# Patient Record
Sex: Female | Born: 1975 | Race: Black or African American | Hispanic: No | Marital: Married | Smoking: Never smoker
Health system: Southern US, Community
[De-identification: ages and names within clinical notes are randomized; demographics above are authoritative.]

---

## 2004-04-08 ENCOUNTER — Observation Stay: Payer: Self-pay | Admitting: Obstetrics & Gynecology

## 2004-04-09 ENCOUNTER — Inpatient Hospital Stay: Payer: Self-pay | Admitting: Obstetrics & Gynecology

## 2011-12-17 DIAGNOSIS — O365990 Maternal care for other known or suspected poor fetal growth, unspecified trimester, not applicable or unspecified: Secondary | ICD-10-CM

## 2011-12-17 DIAGNOSIS — O1410 Severe pre-eclampsia, unspecified trimester: Secondary | ICD-10-CM

## 2012-02-14 LAB — PROTEIN / CREATININE RATIO, URINE
Protein, Random Urine: 198 mg/dL — ABNORMAL HIGH (ref 0–12)
Protein/Creat. Ratio: 851 mg/gCREAT — ABNORMAL HIGH (ref 0–200)

## 2012-02-14 LAB — PIH PROFILE
Anion Gap: 7 (ref 7–16)
BUN: 11 mg/dL (ref 7–18)
Chloride: 109 mmol/L — ABNORMAL HIGH (ref 98–107)
Creatinine: 0.49 mg/dL — ABNORMAL LOW (ref 0.60–1.30)
EGFR (African American): >60
EGFR (Non-African Amer.): >60
HCT: 31.8 % — ABNORMAL LOW (ref 35.0–47.0)
MCH: 29.9 pg (ref 26.0–34.0)
MCHC: 33.1 g/dL (ref 32.0–36.0)
MCV: 91 fL (ref 80–100)
Osmolality: 279 (ref 275–301)
RBC: 3.51 10*6/uL — ABNORMAL LOW (ref 3.80–5.20)
RDW: 17.1 % — ABNORMAL HIGH (ref 11.5–14.5)
SGOT(AST): 34 U/L (ref 15–37)
Sodium: 140 mmol/L (ref 136–145)
Uric Acid: 4.1 mg/dL (ref 2.6–6.0)
WBC: 6.6 10*3/uL (ref 3.6–11.0)

## 2012-02-15 LAB — PROTEIN, URINE, 24 HOUR: Protein, 24 Hour Urine: 1032 mg/24HR — ABNORMAL HIGH (ref 30–149)

## 2012-02-16 ENCOUNTER — Inpatient Hospital Stay: Payer: Self-pay

## 2012-02-16 LAB — PIH PROFILE
BUN: 9 mg/dL (ref 7–18)
Chloride: 106 mmol/L (ref 98–107)
Co2: 21 mmol/L (ref 21–32)
Creatinine: 0.55 mg/dL — ABNORMAL LOW (ref 0.60–1.30)
HGB: 10.5 g/dL — ABNORMAL LOW (ref 12.0–16.0)
MCH: 29.3 pg (ref 26.0–34.0)
MCV: 91 fL (ref 80–100)
Osmolality: 276 (ref 275–301)
Platelet: 307 10*3/uL (ref 150–440)
Potassium: 4.3 mmol/L (ref 3.5–5.1)
RBC: 3.58 10*6/uL — ABNORMAL LOW (ref 3.80–5.20)
RDW: 17.4 % — ABNORMAL HIGH (ref 11.5–14.5)

## 2012-02-16 LAB — PLATELET COUNT: Platelet: 320 10*3/uL (ref 150–440)

## 2012-02-17 LAB — CBC WITH DIFFERENTIAL/PLATELET
Basophil #: 0 10*3/uL (ref 0.0–0.1)
HCT: 31.7 % — ABNORMAL LOW (ref 35.0–47.0)
HGB: 10 g/dL — ABNORMAL LOW (ref 12.0–16.0)
Lymphocyte #: 1 10*3/uL (ref 1.0–3.6)
MCH: 28.7 pg (ref 26.0–34.0)
MCV: 91 fL (ref 80–100)
Monocyte #: 0.7 x10 3/mm (ref 0.2–0.9)
Monocyte %: 5 %
Neutrophil #: 12.2 10*3/uL — ABNORMAL HIGH (ref 1.4–6.5)
Neutrophil %: 87.7 %
Platelet: 313 10*3/uL (ref 150–440)
RBC: 3.48 10*6/uL — ABNORMAL LOW (ref 3.80–5.20)
RDW: 17.7 % — ABNORMAL HIGH (ref 11.5–14.5)
WBC: 14 10*3/uL — ABNORMAL HIGH (ref 3.6–11.0)

## 2012-02-17 LAB — COMPREHENSIVE METABOLIC PANEL
Albumin: 2.4 g/dL — ABNORMAL LOW (ref 3.4–5.0)
Anion Gap: 9 (ref 7–16)
Calcium, Total: 7.7 mg/dL — ABNORMAL LOW (ref 8.5–10.1)
Chloride: 106 mmol/L (ref 98–107)
Co2: 24 mmol/L (ref 21–32)
EGFR (African American): >60
Glucose: 129 mg/dL — ABNORMAL HIGH (ref 65–99)
Osmolality: 278 (ref 275–301)
Potassium: 4.5 mmol/L (ref 3.5–5.1)
SGOT(AST): 39 U/L — ABNORMAL HIGH (ref 15–37)
SGPT (ALT): 62 U/L (ref 12–78)
Sodium: 139 mmol/L (ref 136–145)

## 2012-03-30 ENCOUNTER — Ambulatory Visit: Payer: Self-pay | Admitting: Internal Medicine

## 2014-06-14 NOTE — Consult Note (Signed)
    Maternal Age 40    Gravida 3    Para 2    Term Deliveries 2    Preterm Deliveries 0    Abortions 1    Living Children 2    Gestational Age (wk, days based on mom's dates) 7334    Gestation Single     Additional Comments Called to consult on [name] a 39 year old G3P2012 presenting at 4634 0/[redacted] wks gestation with PIH.  I discussed with mother the morbidity and mortality of infants born at 1134 weeks. We discussed brain, eye, and lung development. We discussed the average weight for this gestation age as well as thermoregulation and the possible need for an isolette. We discussed resuscitation, PIV, lines, respiratory support as well as og tubes. We discussed that the majority of the time these infants do very well and have Po feeding difficulties, and that this Po feeding difficulty is what keeps them hospitalized.  We discussed average discharge date would be at her due date.  I answered all of mother and father???s questions. I am available to speak with them at any time if they have any further follow up needs. Thank you for allowing me to consult on this wonderful family.   Prenatal labs pending at the time of this consultation. Due to possibility of preterm delivery, recommend a course of betamethasone to enhance lung maturity.  Total time 40 minutes    Parental Contact Parents informed at length regarding prenatal care and plan   Electronic Signatures: Corliss ParishGaliote, Elantra Caprara P (MD)  (Signed 21-Dec-13 01:14)  Authored: PREGNANCY and LABOR, ADDITIONAL COMMENTS   Last Updated: 21-Dec-13 01:14 by Corliss ParishGaliote, Izak Anding P (MD)

## 2014-06-17 NOTE — Op Note (Signed)
PATIENT NAME:  Christina Friedman, Christina Friedman MR#:  [id number] DATE OF BIRTH:  1975/07/29  DATE OF PROCEDURE:  02/16/2012  PREOPERATIVE DIAGNOSES:  1. Intrauterine pregnancy at 34 weeks, 3 days gestation. 2. Severe preeclampsia based on blood pressure criteria. 3. Intrauterine growth restriction.  4. Nonreassuring fetal surveillance.  5. Desires permanent surgical sterilization.  POSTOPERATIVE DIAGNOSES:  1. Intrauterine pregnancy at 34 weeks, 3 days gestation. 2. Severe preeclampsia based on blood pressure criteria. 3. Intrauterine growth restriction.  4. Nonreassuring fetal surveillance.  5. Desires permanent surgical sterilization.  PROCEDURES PERFORMED: Primary low transverse cesarean section with Pfannenstiel skin incision and bilateral tubal ligation via Pomeroy method.  PRIMARY SURGEON: Florina Ou. Bonney Aid, MD  ASSISTANT: Farrel Conners, CNM   PREOPERATIVE ANTIBIOTICS: Two grams of Ancef.   DRAINS OR TUBES: Foley to gravity drainage, On-Q catheter system.   IMPLANTS: None.   ANESTHESIA: General.   ESTIMATED BLOOD LOSS: 700 mL.  OPERATIVE FLUIDS: 1900 mL of crystalloid.   URINE OUTPUT: One liter of clear urine.   INTRAOPERATIVE FINDINGS: Normal tubes, uterus and ovaries bilaterally. Liveborn female infant weighing 1730 grams (3 pounds, 13 ounces), 1 minute Apgar 8 and 10 minute Apgar pending.   SPECIMENS REMOVED: Bilateral portion of fallopian tube as well as placenta.   PATIENT CONDITION FOLLOWING PROCEDURE: Stable.   COMPLICATIONS: None.   PROCEDURE IN DETAIL: Risks, benefits and alternatives of the procedure were discussed with the patient prior to proceeding to the operating room. The patient was taken to the operating room where spinal anesthesia was administered. The patient was then prepped and draped in the usual sterile fashion. Timeout was performed. A Pfannenstiel skin incision was made 2 cm above the pubic symphysis and carried down sharply to the level of the  rectus fascia. The fascia was incised in the midline using a knife. The fascial incision was then extended using Mayo scissors. The superior border of the rectus fascia was grasped with two Kocher clamps and the underlying rectus muscle was bluntly dissected off the rectus fascia with the median raphe being incised with Mayo scissors. The inferior border of the rectus fascia was dissected off the rectus muscle in a similar fashion. The midline was identified and the peritoneum was entered bluntly. The peritoneal incision was extended using manual traction. A bladder flap was then developed using Metzenbaum scissors and digital dissection. A hysterotomy incision was made, low transverse, on the uterus, and the uterus was entered bluntly using the operator's finger. At this time, the incision was then extended using manual traction. Upon placing the operator's hand into the uterus, the infant was noted to be in the OA position. The vertex was grasped, flexed, brought to the incision and delivered atraumatically using fundal pressure. The infant was suctioned, cord was clamped and cut, the infant began crying spontaneously and was passed to the awaiting pediatricians. Cord blood was obtained. The placenta was delivered using manual extraction. The uterus was exteriorized and wiped clean of clots and debris. The hysterotomy incision was then closed in two-layer closure with the first being a running locked and the second day a vertical imbricating. Following closure of the hysterotomy, the right tube was grasped with Babcock clamp, in the mid isthmic portion, and doubly suture ligated using a 0 chromic wheel. The intervening knuckle of tube was then excised using Metzenbaum scissors with bilateral tubal ostia visualized and noted to be hemostatic. This was repeated for the patient's left tube. The uterus was returned to the abdomen. The abdomen was wiped  clean of clot and debris using two moist laps. The tubal stumps  were visualized to be hemostatic, at the hysterotomy incision. The peritoneum was then closed using 2-0 Vicryl, in a running fashion. The superior border of the rectus fascia was grasped with a Kocher clamp. The On-Q catheter system was then placed approximately 4 cm above the Pfannenstiel skin incision, in the midline. The catheters were placed subfascially and the fascia was closed using a running looped #1 PDS. Following fascial closure, the subcutaneous tissue was irrigated and hemostasis was achieved using the Bovie. The skin was closed using Insorb staples. The On-Q catheter incisions were then dressed with Dermabond. Each On-Q catheter were bolused with 5 mL of 0.5% Sensorcaine each. Sponge, needle and instrument counts were correct x2. The patient tolerated the procedure well and was taken to the recovery room in stable condition.  ____________________________ Florina OuAndreas M. Bonney AidStaebler, MD ams:sb D: 02/16/2012 22:50:00 ET T: 02/17/2012 10:55:57 ET JOB#: [id number]  cc: Florina OuAndreas M. Bonney AidStaebler, MD, <Dictator> Carmel SacramentoANDREAS Cathrine MusterM Verita Kuroda MD ELECTRONICALLY SIGNED 03/21/2012 10:29

## 2014-07-05 NOTE — H&P (Signed)
L&D Evaluation:  History:   HPI 39 yo G3P2012 @ 34wks EDC 03/26/12 by LMP presents from the office for evaluation of PIH.  She was seen on routine visit today and BP was found to be elevated to 130s/90s with 2+ proteinuria.  Here her BP has been 144-173/69-96.  CBC, CMP, and liver enzymes were WNL but P/C ratio was 851.  She has had a headache that she thinks is sinus related but no blurred vision.  She reports cramping on her right upper and lower quadrants and she has had significant swelling of hands and LEs.  +FM.  No LOF/VB.  + irregular ctxs.    Presents with swelling and elevated BP for PIH eval    Patient's Medical History No Chronic Illness    Patient's Surgical History none    Medications Pre Natal Vitamins    Allergies Cats, Pollen    Social History none    Family History DM, Lung CA   ROS:   ROS See HPI, all others neg   Exam:   Vital Signs BP >140/90    Urine Protein P/C ratio 851    General no apparent distress    Mental Status clear    Chest clear    Heart normal sinus rhythm    Abdomen gravid, non-tender    Edema 3+  Pitting    Reflexes 1+    Clonus negative    Pelvic Not examined    Mebranes Intact    FHT other, Baseline 145, quick variable decels, has had one 15x15 accel    Fetal Heart Rate 145     Ucx absent    Skin dry   Impression:   Impression Mild preeclampsia r/o severe preeclampsia   Plan:   Comments 1)  BMZ given likelihood of having to deliver pt early.  Neonatology c/s in AM. 2)  24hr urine for more accurate evaluation of proteinuria. 3)  May have clear liquid diet tonight. 4)  S/Sx of preeclampsia discussed with pt.  She will alert us with worsening HA, blurred vision, worsening RUQ pain, decr FM, ctxs or bleeding. 5)  Treat BP over 160/110 with Hydralazine vs Labetalol.   Electronic Signatures: Senaida LangeWeaver-Lee, Annina Piotrowski (MD)  (Signed 20-Dec-13 21:05)  Authored: L&D Evaluation   Last Updated: 20-Dec-13 21:05 by  Senaida LangeWeaver-Lee, Myrella Fahs (MD)

## 2018-03-16 ENCOUNTER — Other Ambulatory Visit: Payer: Self-pay | Admitting: Obstetrics and Gynecology

## 2018-03-16 DIAGNOSIS — Z1231 Encounter for screening mammogram for malignant neoplasm of breast: Secondary | ICD-10-CM

## 2018-03-30 ENCOUNTER — Ambulatory Visit
Admission: RE | Admit: 2018-03-30 | Discharge: 2018-03-30 | Disposition: A | Discharge status: Home / Self Care | Payer: BLUE CROSS/BLUE SHIELD | Source: Ambulatory Visit | Attending: Obstetrics and Gynecology | Admitting: Obstetrics and Gynecology

## 2018-03-30 ENCOUNTER — Encounter: Payer: Self-pay | Admitting: Radiology

## 2018-03-30 DIAGNOSIS — Z1231 Encounter for screening mammogram for malignant neoplasm of breast: Secondary | ICD-10-CM | POA: Insufficient documentation

## 2018-05-19 ENCOUNTER — Telehealth: Payer: Self-pay | Admitting: Obstetrics and Gynecology

## 2018-05-19 NOTE — Telephone Encounter (Signed)
Can schedule telephone visit

## 2018-05-19 NOTE — Telephone Encounter (Signed)
Patient was schedule for 05/22/18 for annual with Dr. Bonney Aid. Upon rescheduling appointment patient requested letting dr. Bonney Aid know she has been having so pelvic pain and wondered if she needed to go to urgent care or if if there is a way he could call her. Please advise scheduling telephone visit.

## 2018-05-19 NOTE — Telephone Encounter (Signed)
Patient is schedule 05/22/18 with AMS

## 2018-05-22 ENCOUNTER — Ambulatory Visit (INDEPENDENT_AMBULATORY_CARE_PROVIDER_SITE_OTHER): Payer: BLUE CROSS/BLUE SHIELD | Admitting: Obstetrics and Gynecology

## 2018-05-22 ENCOUNTER — Encounter: Payer: Self-pay | Admitting: Obstetrics and Gynecology

## 2018-05-22 ENCOUNTER — Other Ambulatory Visit: Payer: Self-pay

## 2018-05-22 ENCOUNTER — Ambulatory Visit: Payer: Self-pay | Admitting: Obstetrics and Gynecology

## 2018-05-22 DIAGNOSIS — N939 Abnormal uterine and vaginal bleeding, unspecified: Secondary | ICD-10-CM | POA: Diagnosis not present

## 2018-05-22 MED ORDER — MEDROXYPROGESTERONE ACETATE 10 MG PO TABS: 10.0000 mg | ORAL_TABLET | Freq: Every day | ORAL | 2 refills | Status: DC

## 2018-05-22 NOTE — Progress Notes (Signed)
I connected with@ on 05/22/18 at  9:30 AM EDT by telephone and verified that I am speaking with the correct person using two identifiers.   I discussed the limitations, risks, security and privacy concerns of performing an evaluation and management service by telephone and the availability of in person appointments. I also discussed with the patient that there may be a patient responsible charge related to this service. The patient expressed understanding and agreed to proceed.  The patient was at home I spoke with the patient from my  Workstation phone The names of people involved in this encounter were: Christina Friedman , and Christina Friedman   Gynecology Abnormal Uterine Bleeding Initial Evaluation   Chief Complaint: No chief complaint on file.   History of Present Illness:    Paitient is a 43 y.o. [id number] who LMP was No LMP recorded., presents today for a problem visit.  She complains of metrorrhagia (no pattern) that  began several months ago and its severity is described as moderate.  The patient menstrual complaints are chronic present for the past 6 months.  She has irregular periods and they are associated with moderate menstrual cramping.  The patient is sexually active. She currently uses tubal ligationfor contraception.  Last Pap results esults were obtained 02/02/2014 NIL and HR HPV negative   Previous evaluation: none Prior Diagnosis: none Previous Treatment: none.  No recent imaging but imaging from last pregnancy 2013 without evidence of fibroids at that time  Last summer menstrual cycle started getting abnormal.  Initially spacing out to every 6 weeks.  Then started being more frequent and prolonged.  Random as far as flow.  Bleeding post intercourse.  Was  a week of spotting.     The patient relays a history of regular monthly menses up to last summer.  At that time there was some lengthening in cycle intervals to 6 weeks.  She does report some mild vasomotor symptoms  but reports labs in 2018 at employment screening were not consistent with menopause.  She now has noted menses coming closer together as well.  She reports dull back pain preceeding menstrual cycle.  Length of bleeding is variable.  She does state that she always starts bleeding after intercourse and this may be as heavy as a regular menstrual cycle although often time the color of blood will be brown. Has noted some suprapubic soreness, exacerbated by movement.    The patient has achieved a fairly significant weight loss since she last met with me in 2016, with current weight 172lbs  down from 191lbs.  She is currently on metformin off label to aid in weight loss.  LMP: No LMP recorded.  Paramter Normal / Abnormal Prsent  Frequency Amenoorhea     Infrequent (>38 days)     Normal (?24 days ?38 days)     Freequent (<24 days) X  Duration Normal (?8 days) X   Prolonged (>8 days)    Regularity Regular (shortest to longest cycle variation ?7-9 days)*     Irregular (shortest to longest cycle variation ?8-10days)* X  Flow Volume Light    (Self reported) Normal     Heavy X      Intermenstrual Bleeding None     Random X   Cyclical early     Cyclical mid     Cyclical late        Unscheduled Bleeding  Not applicable X  (exogenous hormones) Absent     Present     FIGO  AUB I System: *The available evidence suggests that, using these criteria, the normal range (shortest to longest) varies with age: 6-25 y of age, ?44 d; 58-41 y, ?7 d; and for 62-45 y, ?9 d    Review of Systems: Review of Systems  Constitutional: Negative.   Gastrointestinal: Positive for abdominal pain. Negative for constipation, diarrhea, heartburn and nausea.  Genitourinary: Negative.   Musculoskeletal: Positive for back pain.    Past Medical History:  No past medical history on file.  Past Surgical History:  Past Surgical History:  Procedure Laterality Date  . CESAREAN SECTION WITH BILATERAL TUBAL LIGATION N/A  02/16/2012   fetal intolerance, IUGR, severe preeclampsia    Obstetric History: [id number]  Family History:  Family History  Problem Relation Age of Onset  . Diabetes Mellitus II Sister   . Colon cancer Maternal Grandmother 31  . Lung cancer Maternal Grandmother     Social History:  Social History   Socioeconomic History  . Marital status: Married    Spouse name: Not on file  . Number of children: 3  . Years of education: Not on file  . Highest education level: Bachelor's degree (e.g., BA, AB, BS)  Occupational History  . Occupation: Education officer, museum  Social Needs  . Financial resource strain: Not on file  . Food insecurity:    Worry: Not on file    Inability: Not on file  . Transportation needs:    Medical: Not on file    Non-medical: Not on file  Tobacco Use  . Smoking status: Never Smoker  . Smokeless tobacco: Never Used  Substance and Sexual Activity  . Alcohol use: Not on file  . Drug use: Not Currently  . Sexual activity: Yes    Partners: Male    Birth control/protection: Surgical  Lifestyle  . Physical activity:    Days per week: Not on file    Minutes per session: Not on file  . Stress: Not on file  Relationships  . Social connections:    Talks on phone: Not on file    Gets together: Not on file    Attends religious service: Not on file    Active member of club or organization: Not on file    Attends meetings of clubs or organizations: Not on file    Relationship status: Not on file  . Intimate partner violence:    Fear of current or ex partner: Not on file    Emotionally abused: Not on file    Physically abused: Not on file    Forced sexual activity: Not on file  Other Topics Concern  . Not on file  Social History Narrative  . Not on file    Allergies:  Allergies  Allergen Reactions  . Cat Hair Extract Itching    Medications: Prior to Admission medications   Medication Sig Start Date End Date Taking? Authorizing Provider   medroxyPROGESTERone (PROVERA) 10 MG tablet Take 1 tablet (10 mg total) by mouth daily. 05/22/18   Malachy Mood, MD    Physical Exam There were no vitals taken for this visit.  No LMP recorded.  Physical exam was not performed as this was a telephone visit secondary to COVID-19 pandemic  Assessment: 43 y.o. [id number] with abnormal uterine bleeding  Plan: Problem List Items Addressed This Visit    None    Visit Diagnoses    Abnormal uterine bleeding    -  Primary      1) Discussed management options for abnormal  uterine bleeding including expectant, NSAIDs, tranexamic acid (Lysteda), oral progesterone (Provera, norethindrone, megace), Depo Provera, Levonorgestrel containing IUD, endometrial ablation (Novasure) or hysterectomy as definitive surgical management.  Discussed risks and benefits of each method.   Final management decision will hinge on results of patient's work up and whether an underlying etiology for the patients bleeding symptoms can be discerned.  A basic work up examining  the PALM-COIEN classification system.  In the meantime the patient opts to trial provera 23m po daily.  The role of unopposed estrogen in the development of endometrial hyperplasia or carcinoma is discussed.  The risk of endometrial hyperplasia is linearly correlated with increasing BMI given the production of estrone by adipose tissue.  - pap smear is up to date - work up to proceed once COVID-19 restrictions lifted  2) A total of 22 minutes were spent in on the phonewith the patient during this encounter for counseling and coordination of care.  3)  Return in about 6 weeks (around 07/03/2018) for Telephone follow up.   AMalachy Mood MD, FLoura PardonOB/GYN, CDeSales [employer]

## 2018-07-03 ENCOUNTER — Ambulatory Visit: Payer: Self-pay | Admitting: Obstetrics and Gynecology

## 2018-08-14 ENCOUNTER — Other Ambulatory Visit: Payer: Self-pay | Admitting: Obstetrics and Gynecology

## 2018-08-14 NOTE — Telephone Encounter (Signed)
Advise

## 2020-06-25 IMAGING — MG DIGITAL SCREENING BILATERAL MAMMOGRAM WITH TOMO AND CAD
8 series · 8 of 24 positions shown · non-contrast
Comparison: Previous exam(s).

CLINICAL DATA: Screening.

EXAM:
DIGITAL SCREENING BILATERAL MAMMOGRAM WITH TOMO AND CAD

[L MLO synth-2D]
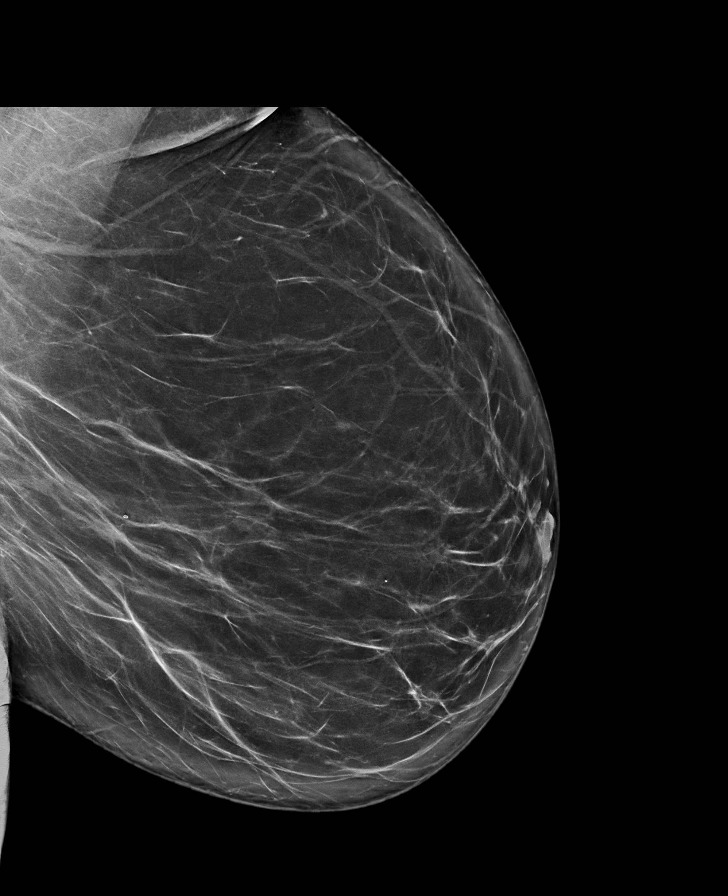

[R MLO synth-2D]
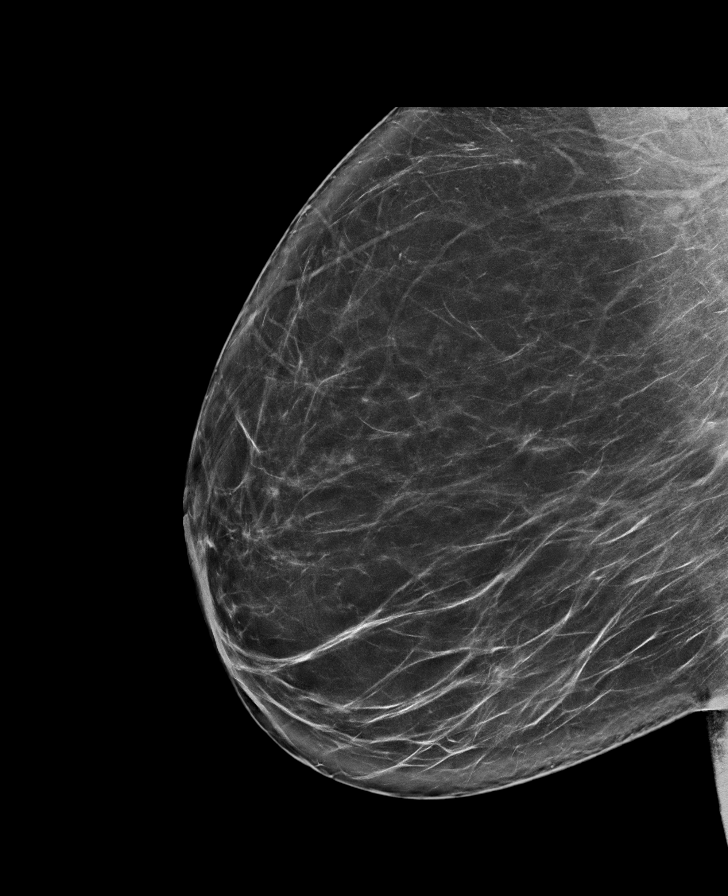

[R CC synth-2D]
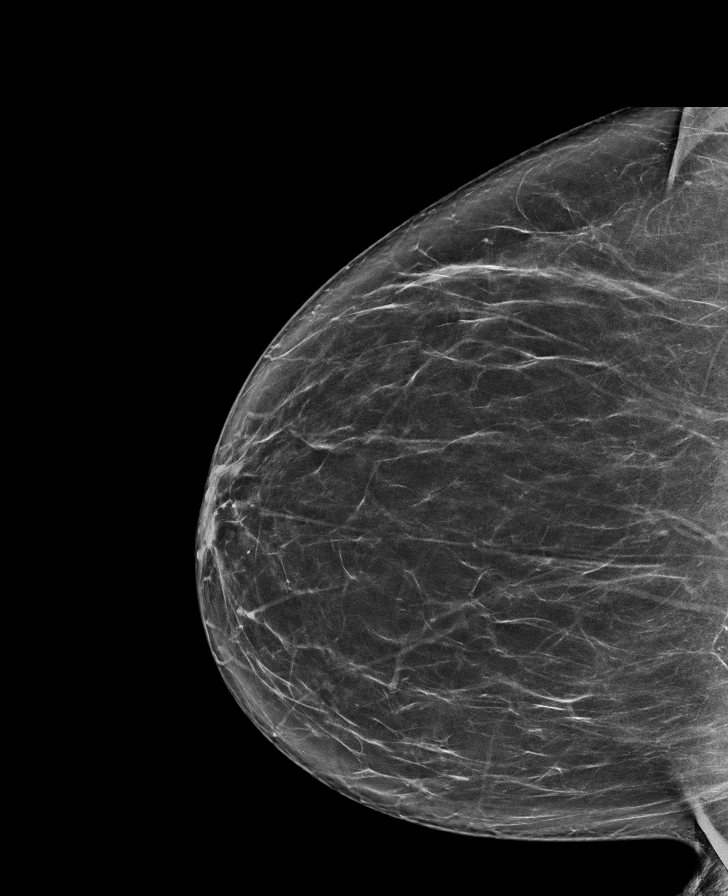

[L CC synth-2D]
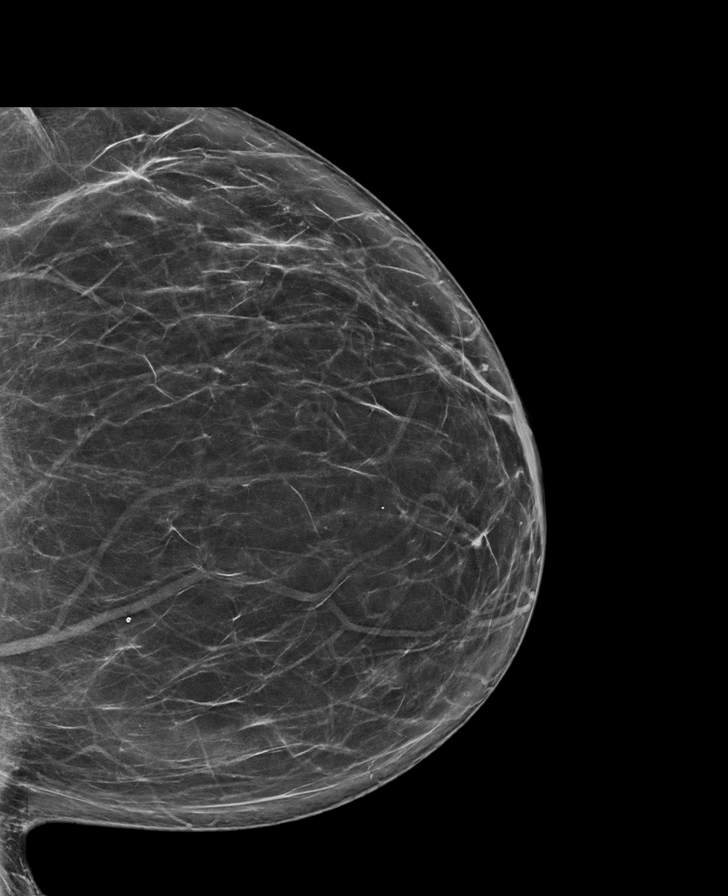

[R MLO tomo · tomo slice 45/89.0]
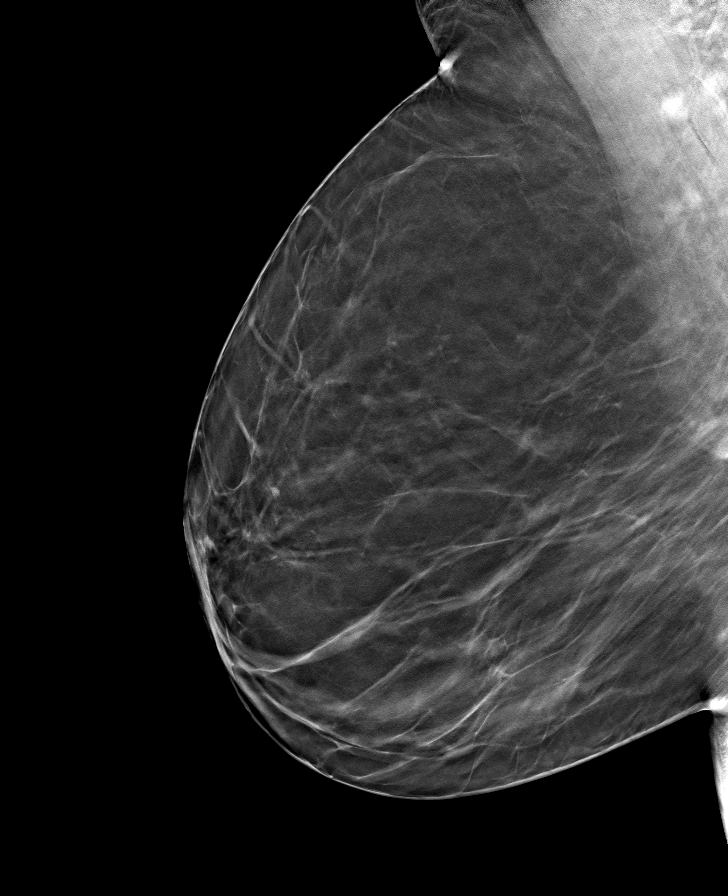

[R CC tomo · tomo slice 43/86.0]
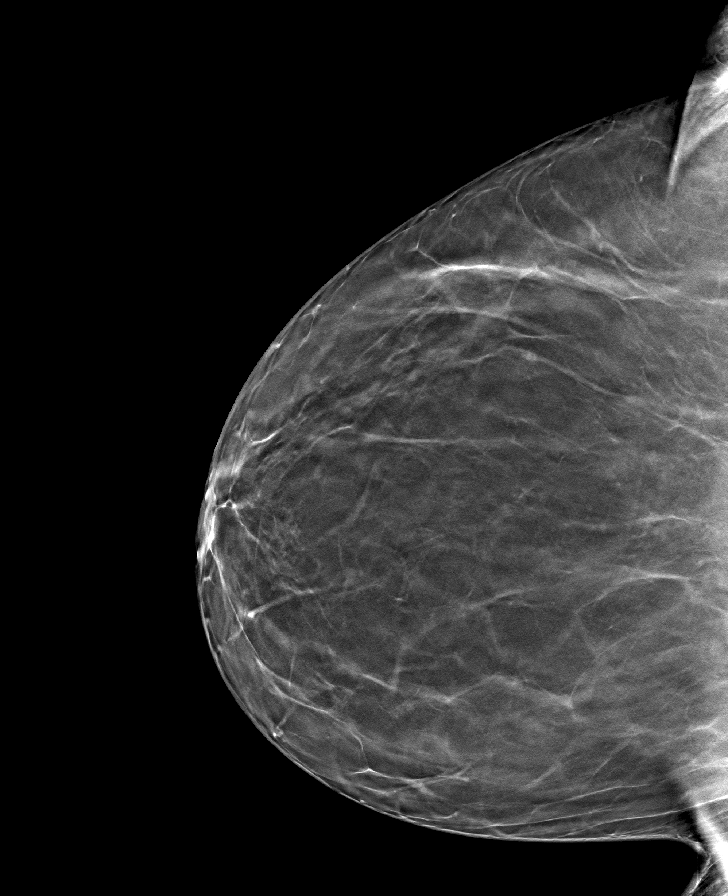

[L MLO tomo · tomo slice 47/93.0]
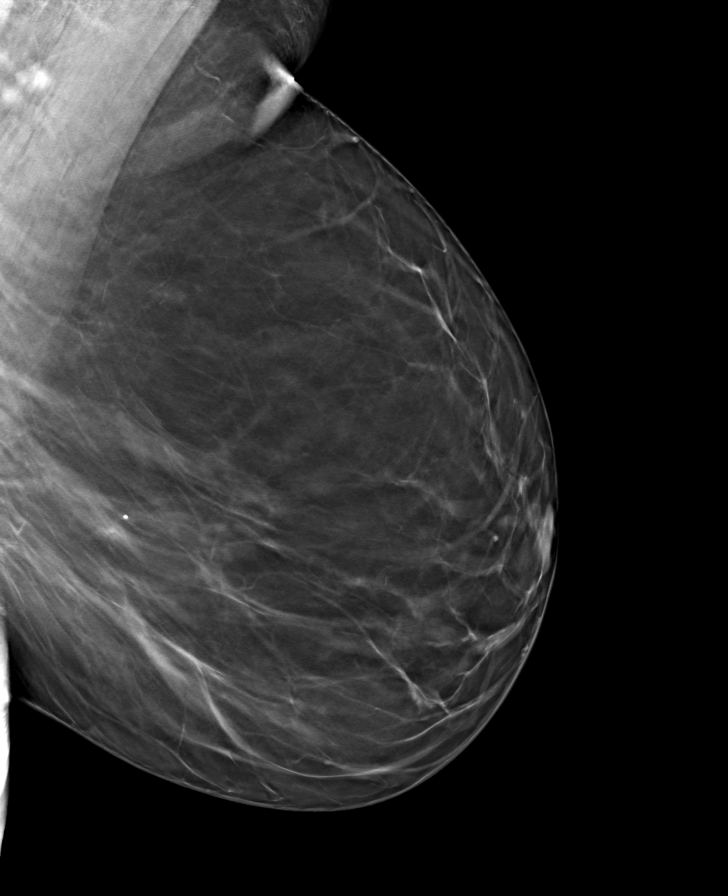

[L CC tomo · tomo slice 41/82.0]
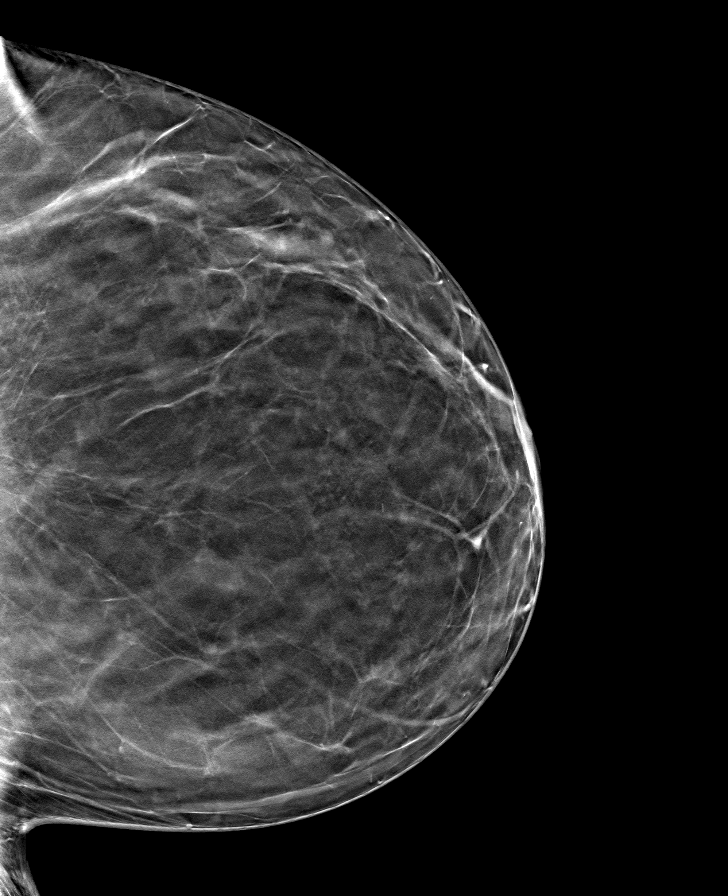

[8 of 24 positions shown; findings below may reference images not displayed]

ACR Breast Density Category b: There are scattered areas of
fibroglandular density.
FINDINGS: There are no findings suspicious for malignancy. Images were
processed with CAD.
IMPRESSION: No mammographic evidence of malignancy. A result letter of this
screening mammogram will be mailed directly to the patient.

RECOMMENDATION:
Screening mammogram in one year. (Code:[id number])

BI-RADS CATEGORY  1: Negative.
